# Patient Record
Sex: Male | Born: 1990
Health system: Southern US, Community
[De-identification: ages and names within clinical notes are randomized; demographics above are authoritative.]

## PROBLEM LIST (undated history)

## (undated) DIAGNOSIS — J45909 Unspecified asthma, uncomplicated: Secondary | ICD-10-CM

## (undated) HISTORY — PX: TESTICLE REMOVAL: SHX68

## (undated) HISTORY — DX: Unspecified asthma, uncomplicated: J45.909

---
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
## 2008-09-16 IMAGING — CT CT NECK WITHOUT AND WITH CONTRAST
2 of 3 series · 9 of 14 positions shown, 11 images · non-contrast
Comparison: none

REASON FOR EXAM: MANDIBULUR SALIVARY GLAND VS NODE; PAIN TODAY
COMMENTS:

[Series 3: soft tissue · axial · 0.62mm/px · z∈[+621,+837]mm · 5 of 109 slices shown, 7 images (1 of 2)]
[im 19/109  soft-tissue]
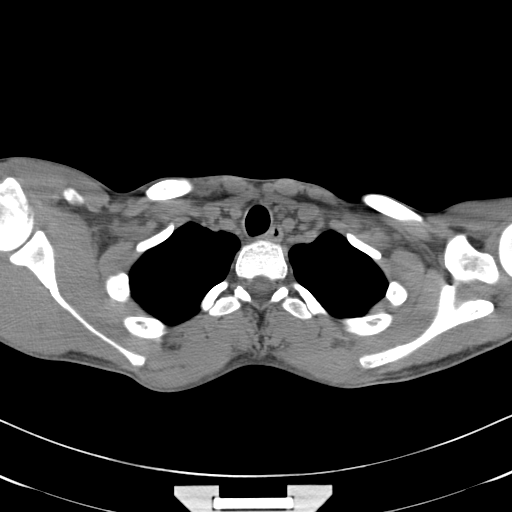
[im 19/109  bone]
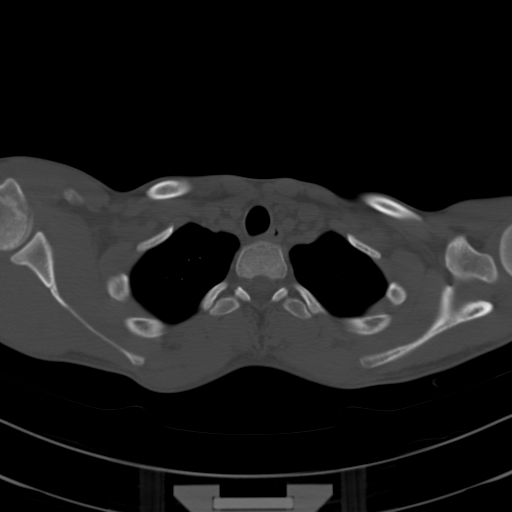
[im 37/109  bone]
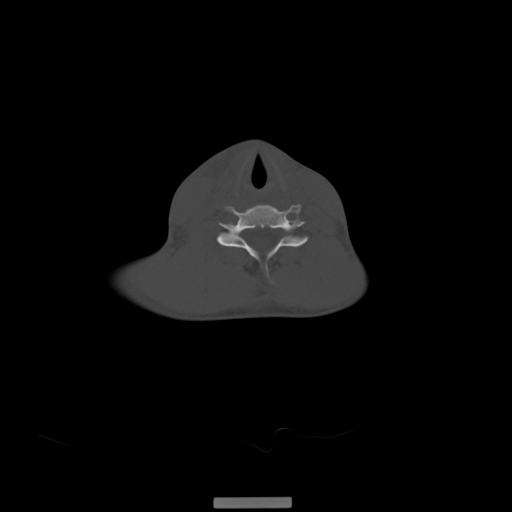
[im 55/109  bone]
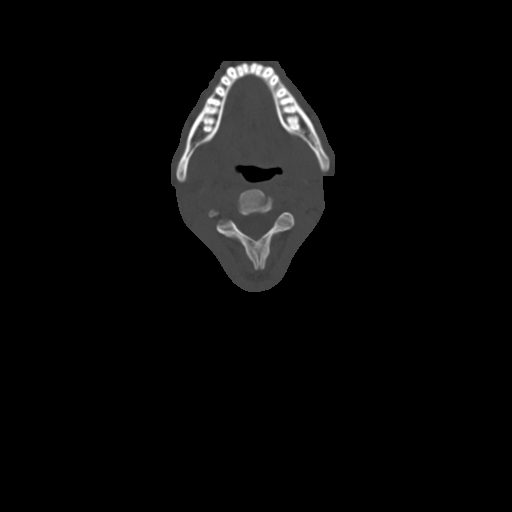
[im 73/109  bone]
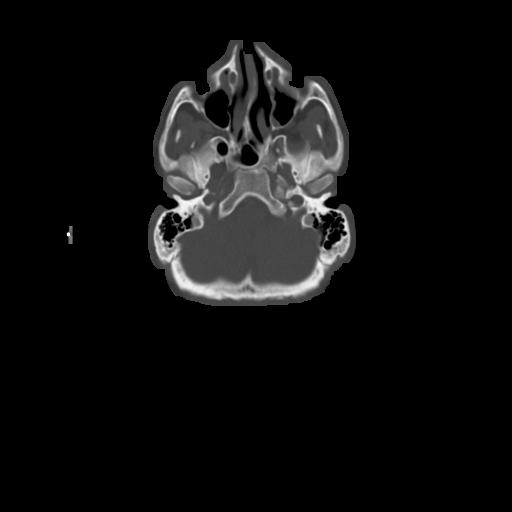
[im 91/109  soft-tissue]
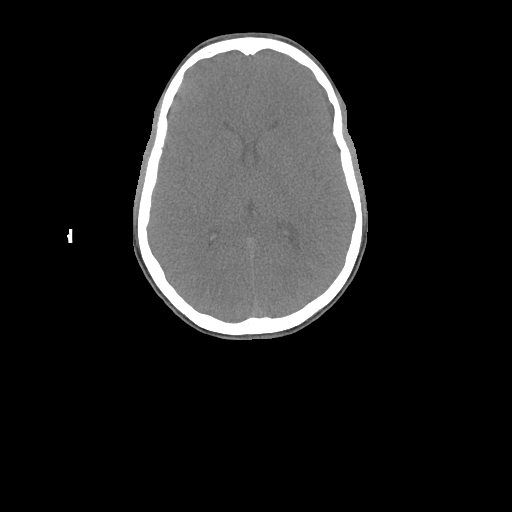
[im 91/109  bone]
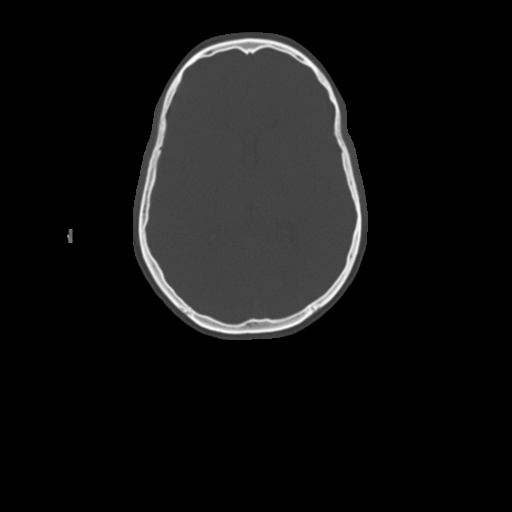

[Series 5: soft tissue · axial · 0.62mm/px · z∈[+640,+826]mm · 4 of 104 slices shown (2 of 2)]
[im 21/104  soft-tissue]
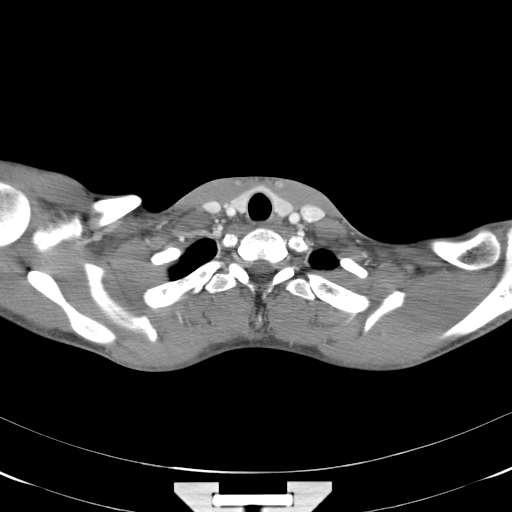
[im 42/104  soft-tissue]
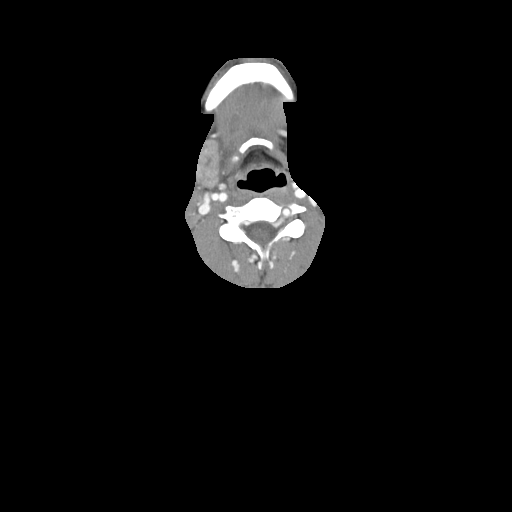
[im 62/104  soft-tissue]
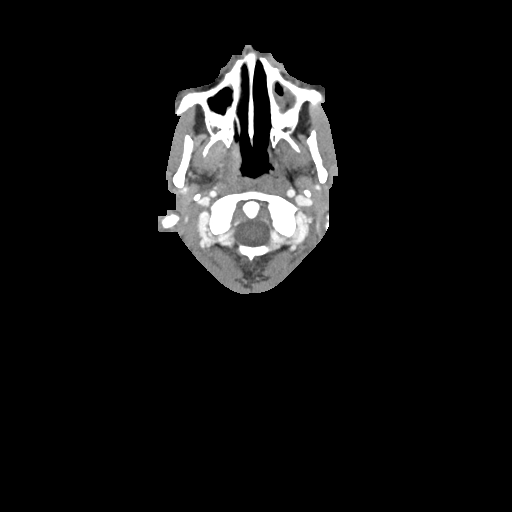
[im 83/104  soft-tissue]
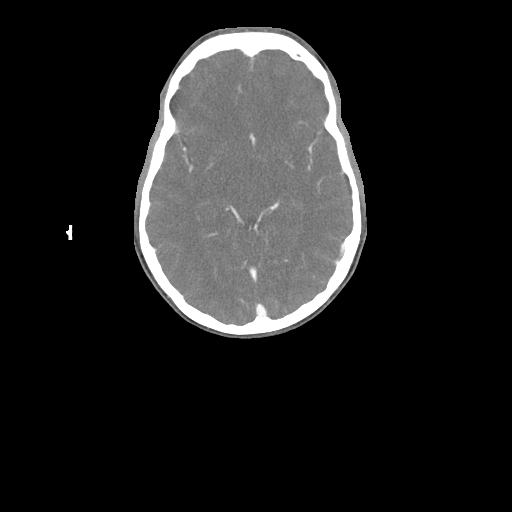

[9 of 14 positions shown; findings below may reference images not displayed]

PROCEDURE:     CT  - CT NECK W/WO  - June 08, 2006  [DATE]

RESULT:     Noncontrast and post contrast CT of the neck is performed in the
standard fashion. The noncontrast images do not show definite salivary gland
or ductal stone. Images obtained following administration of iodinated
contrast intravenously show some mucus retention cysts in the left maxillary
sinus. There is no definite mass or focal adenopathy. The right
submandibular gland appears to be somewhat enlarged. A discrete mass is not
identified within the gland. Nonenlarged submental lymph node is present the
left. This measures up to 9 mm in length.

There appears to be some fluid tracking inferior to the submandibular gland
in the right neck which can represent cellulitis. No discrete abscess is
identified at this time. The thyroid lobes enhance normally.
IMPRESSION: 1. Enlargement of the right submandibular gland with a somewhat
heterogeneous enhancement pattern and adjacent fluid suggestive of
inflammation. No discreet abscess is seen. No definite stones are identified.

## 2015-08-29 ENCOUNTER — Encounter: Payer: Self-pay | Admitting: Family Medicine

## 2015-08-29 ENCOUNTER — Ambulatory Visit (INDEPENDENT_AMBULATORY_CARE_PROVIDER_SITE_OTHER): Payer: BLUE CROSS/BLUE SHIELD | Admitting: Family Medicine

## 2015-08-29 VITALS — BP 120/80 | HR 80 | Ht 69.0 in | Wt 120.0 lb

## 2015-08-29 DIAGNOSIS — Z7189 Other specified counseling: Secondary | ICD-10-CM | POA: Diagnosis not present

## 2015-08-29 DIAGNOSIS — J4520 Mild intermittent asthma, uncomplicated: Secondary | ICD-10-CM

## 2015-08-29 DIAGNOSIS — Z7689 Persons encountering health services in other specified circumstances: Secondary | ICD-10-CM

## 2015-08-29 MED ORDER — FLUTICASONE-SALMETEROL 100-50 MCG/DOSE IN AEPB: 1.0000 | INHALATION_SPRAY | Freq: Two times a day (BID) | RESPIRATORY_TRACT | Status: AC

## 2015-08-29 MED ORDER — ALBUTEROL SULFATE HFA 108 (90 BASE) MCG/ACT IN AERS: 2.0000 | INHALATION_SPRAY | Freq: Four times a day (QID) | RESPIRATORY_TRACT | Status: AC | PRN

## 2015-08-29 NOTE — Progress Notes (Signed)
Name: Darius Powers   MRN: [id number]    DOB: 07-Jul-1990   Date:08/29/2015       Progress Note  Subjective  Chief Complaint  Chief Complaint  Patient presents with  . Establish Care    new to area  . Asthma    HPI Comments: Patient presents for establishing care with PCP>  Asthma There is no chest tightness, cough, difficulty breathing, frequent throat clearing, hemoptysis, hoarse voice, shortness of breath, sputum production or wheezing. This is a chronic problem. The current episode started more than 1 year ago. The problem has been waxing and waning. Pertinent negatives include no chest pain, ear pain, fever, headaches, heartburn, malaise/fatigue, myalgias, sore throat or weight loss. His symptoms are aggravated by pollen, change in weather and exposure to smoke. His symptoms are alleviated by beta-agonist. He reports moderate improvement on treatment. His symptoms are not alleviated by beta-agonist. His past medical history is significant for asthma. There is no history of bronchiectasis or bronchitis.    No problem-specific assessment & plan notes found for this encounter.   Past Medical History  Diagnosis Date  . Asthma     Past Surgical History  Procedure Laterality Date  . Testicle removal Right     d/t cancer    Family History  Problem Relation Age of Onset  . Diabetes Mother   . Stroke Paternal Grandfather     Social History   Social History  . Marital Status: Single    Spouse Name: N/A  . Number of Children: N/A  . Years of Education: N/A   Occupational History  . Not on file.   Social History Main Topics  . Smoking status: Current Every Day Smoker  . Smokeless tobacco: Not on file  . Alcohol Use: 0.0 oz/week    0 Standard drinks or equivalent per week  . Drug Use: No  . Sexual Activity: Not Currently   Other Topics Concern  . Not on file   Social History Narrative  . No narrative on file    No Known Allergies   Review of Systems   Constitutional: Negative for fever, chills, weight loss and malaise/fatigue.  HENT: Negative for ear discharge, ear pain, hoarse voice and sore throat.   Eyes: Negative for blurred vision.  Respiratory: Negative for cough, hemoptysis, sputum production, shortness of breath and wheezing.   Cardiovascular: Negative for chest pain, palpitations and leg swelling.  Gastrointestinal: Negative for heartburn, nausea, abdominal pain, diarrhea, constipation, blood in stool and melena.  Genitourinary: Negative for dysuria, urgency, frequency and hematuria.  Musculoskeletal: Negative for myalgias, back pain, joint pain and neck pain.  Skin: Negative for rash.  Neurological: Negative for dizziness, tingling, sensory change, focal weakness and headaches.  Endo/Heme/Allergies: Negative for environmental allergies and polydipsia. Does not bruise/bleed easily.  Psychiatric/Behavioral: Negative for depression and suicidal ideas. The patient is not nervous/anxious and does not have insomnia.      Objective  Filed Vitals:   08/29/15 1521  BP: 120/80  Pulse: 80  Height: 5\' 9"  (1.753 m)  Weight: 120 lb (54.432 kg)    Physical Exam  Constitutional: He is oriented to person, place, and time and well-developed, well-nourished, and in no distress.  HENT:  Head: Normocephalic.  Right Ear: External ear normal.  Left Ear: External ear normal.  Nose: Nose normal.  Mouth/Throat: Oropharynx is clear and moist.  Eyes: Conjunctivae and EOM are normal. Pupils are equal, round, and reactive to light. Right eye exhibits no discharge. Left eye  exhibits no discharge. No scleral icterus.  Neck: Normal range of motion. Neck supple. No JVD present. No tracheal deviation present. No thyromegaly present.  Cardiovascular: Normal rate, regular rhythm, normal heart sounds and intact distal pulses.  Exam reveals no gallop and no friction rub.   No murmur heard. Pulmonary/Chest: Breath sounds normal. No respiratory distress.  He has no wheezes. He has no rales.  Abdominal: Soft. Bowel sounds are normal. He exhibits no mass. There is no hepatosplenomegaly. There is no tenderness. There is no rebound, no guarding and no CVA tenderness.  Musculoskeletal: Normal range of motion. He exhibits no edema or tenderness.  Lymphadenopathy:    He has no cervical adenopathy.  Neurological: He is alert and oriented to person, place, and time. He has normal sensation, normal strength and intact cranial nerves. No cranial nerve deficit.  Skin: Skin is warm. No rash noted.  Psychiatric: Mood and affect normal.  Nursing note and vitals reviewed.     Assessment & Plan  Problem List Items Addressed This Visit    None    Visit Diagnoses    Encounter to establish care with new doctor    -  Primary    Reactive airway disease, mild intermittent, uncomplicated        Relevant Medications    albuterol (PROVENTIL HFA;VENTOLIN HFA) 108 (90 Base) MCG/ACT inhaler    Fluticasone-Salmeterol (ADVAIR DISKUS) 100-50 MCG/DOSE AEPB         Dr. [facility]  08/29/2015

## 2017-02-07 ENCOUNTER — Other Ambulatory Visit: Payer: Self-pay

## 2017-12-22 ENCOUNTER — Encounter: Payer: Self-pay | Admitting: Family Medicine

## 2017-12-22 ENCOUNTER — Ambulatory Visit: Payer: BLUE CROSS/BLUE SHIELD | Admitting: Family Medicine

## 2017-12-22 VITALS — BP 120/70 | HR 68 | Temp 97.8°F | Ht 69.0 in | Wt 117.0 lb

## 2017-12-22 DIAGNOSIS — N644 Mastodynia: Secondary | ICD-10-CM

## 2017-12-22 MED ORDER — MELOXICAM 15 MG PO TABS: 15.0000 mg | ORAL_TABLET | Freq: Every day | ORAL | 0 refills | Status: AC

## 2017-12-22 NOTE — Progress Notes (Signed)
Name: Darius Powers   MRN: [id number]    DOB: 1991-03-27   Date:12/22/2017       Progress Note  Subjective  Chief Complaint  Chief Complaint  Patient presents with  . Mass    in L) breast- was wearing a vest and it was rubbing against it, making it hurt    Patient presents with mastalgia for " couple days" This is associated with wearing a reflective vest at work.   No problem-specific Assessment & Plan notes found for this encounter.   Past Medical History:  Diagnosis Date  . Asthma     Past Surgical History:  Procedure Laterality Date  . TESTICLE REMOVAL Right    d/t cancer    Family History  Problem Relation Age of Onset  . Diabetes Mother   . Stroke Paternal Grandfather     Social History   Socioeconomic History  . Marital status: Single    Spouse name: Not on file  . Number of children: Not on file  . Years of education: Not on file  . Highest education level: Not on file  Occupational History  . Not on file  Social Needs  . Financial resource strain: Not on file  . Food insecurity:    Worry: Not on file    Inability: Not on file  . Transportation needs:    Medical: Not on file    Non-medical: Not on file  Tobacco Use  . Smoking status: Current Every Day Smoker  . Tobacco comment: offered smoking cessation- pt refused  Substance and Sexual Activity  . Alcohol use: Yes    Alcohol/week: 0.0 standard drinks  . Drug use: No  . Sexual activity: Not Currently  Lifestyle  . Physical activity:    Days per week: Not on file    Minutes per session: Not on file  . Stress: Not on file  Relationships  . Social connections:    Talks on phone: Not on file    Gets together: Not on file    Attends religious service: Not on file    Active member of club or organization: Not on file    Attends meetings of clubs or organizations: Not on file    Relationship status: Not on file  . Intimate partner violence:    Fear of current or ex partner: Not on file   Emotionally abused: Not on file    Physically abused: Not on file    Forced sexual activity: Not on file  Other Topics Concern  . Not on file  Social History Narrative  . Not on file    No Known Allergies  Outpatient Medications Prior to Visit  Medication Sig Dispense Refill  . albuterol (PROVENTIL HFA;VENTOLIN HFA) 108 (90 Base) MCG/ACT inhaler Inhale 2 puffs into the lungs every 6 (six) hours as needed for wheezing or shortness of breath. 1 Inhaler 11  . Fluticasone-Salmeterol (ADVAIR DISKUS) 100-50 MCG/DOSE AEPB Inhale 1 puff into the lungs 2 (two) times daily. 1 each 11   No facility-administered medications prior to visit.     Review of Systems  Constitutional: Negative for chills, fever, malaise/fatigue and weight loss.  HENT: Negative for ear discharge, ear pain and sore throat.   Eyes: Negative for blurred vision.  Respiratory: Negative for cough, sputum production, shortness of breath and wheezing.   Cardiovascular: Negative for chest pain, palpitations and leg swelling.  Gastrointestinal: Negative for abdominal pain, blood in stool, constipation, diarrhea, heartburn, melena and nausea.  Genitourinary: Negative  for dysuria, frequency, hematuria and urgency.  Musculoskeletal: Negative for back pain, joint pain, myalgias and neck pain.  Skin: Negative for rash.  Neurological: Negative for dizziness, tingling, sensory change, focal weakness and headaches.  Endo/Heme/Allergies: Negative for environmental allergies and polydipsia. Does not bruise/bleed easily.  Psychiatric/Behavioral: Negative for depression and suicidal ideas. The patient is not nervous/anxious and does not have insomnia.      Objective  Vitals:   12/22/17 1022  BP: 120/70  Pulse: 68  Temp: 97.8 F (36.6 C)  TempSrc: Oral  Weight: 117 lb (53.1 kg)  Height: 5\' 9"  (1.753 m)    Physical Exam  Constitutional: He is oriented to person, place, and time.  HENT:  Head: Normocephalic.  Right Ear:  External ear normal.  Left Ear: External ear normal.  Nose: Nose normal.  Mouth/Throat: Oropharynx is clear and moist.  Eyes: Pupils are equal, round, and reactive to light. Conjunctivae and EOM are normal. Right eye exhibits no discharge. Left eye exhibits no discharge. No scleral icterus.  Neck: Normal range of motion. Neck supple. No JVD present. No tracheal deviation present. No thyromegaly present.  Cardiovascular: Normal rate, regular rhythm, normal heart sounds and intact distal pulses. Exam reveals no gallop and no friction rub.  No murmur heard. Pulmonary/Chest: Breath sounds normal. No respiratory distress. He has no wheezes. He has no rales. Right breast exhibits no inverted nipple, no mass, no nipple discharge, no skin change and no tenderness. Left breast exhibits tenderness. Left breast exhibits no inverted nipple, no mass, no nipple discharge and no skin change.  Palpable breast bud area.    Abdominal: Soft. Bowel sounds are normal. He exhibits no mass. There is no hepatosplenomegaly. There is no tenderness. There is no rebound, no guarding and no CVA tenderness.  Musculoskeletal: Normal range of motion. He exhibits no edema or tenderness.  Lymphadenopathy:    He has no cervical adenopathy.  Neurological: He is alert and oriented to person, place, and time. He has normal strength and normal reflexes. No cranial nerve deficit.  Skin: Skin is warm. No rash noted.  Nursing note and vitals reviewed.     Assessment & Plan  Problem List Items Addressed This Visit    None    Visit Diagnoses    Mastalgia    -  Primary   Acute 1x0.25 cm area of tenderness left breast c/w excoriated area /Conservative coverage with nonadhesive pad. rtc if unresolved   Relevant Medications   meloxicam (MOBIC) 15 MG tablet      Meds ordered this encounter  Medications  . meloxicam (MOBIC) 15 MG tablet    Sig: Take 1 tablet (15 mg total) by mouth daily.    Dispense:  14 tablet    Refill:  0       Dr. Elizabeth [facility]  12/22/17
